# Patient Record
Sex: Male | Born: 1985 | Race: Black or African American | Hispanic: No | Marital: Single | Smoking: Current every day smoker
Health system: Southern US, Community
[De-identification: ages and names within clinical notes are randomized; demographics above are authoritative.]

---
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
## 2006-08-21 ENCOUNTER — Emergency Department: Payer: Self-pay | Admitting: Unknown Physician Specialty

## 2007-03-24 ENCOUNTER — Emergency Department: Payer: Self-pay | Admitting: Emergency Medicine

## 2008-10-12 ENCOUNTER — Emergency Department: Payer: Self-pay | Admitting: Unknown Physician Specialty

## 2011-05-17 ENCOUNTER — Emergency Department: Payer: Self-pay | Admitting: *Deleted

## 2019-07-13 ENCOUNTER — Encounter: Payer: Self-pay | Admitting: *Deleted

## 2019-07-13 ENCOUNTER — Emergency Department
Admission: EM | Admit: 2019-07-13 | Discharge: 2019-07-13 | Disposition: A | Discharge status: Home / Self Care | Payer: Self-pay | Attending: Emergency Medicine | Admitting: Emergency Medicine

## 2019-07-13 ENCOUNTER — Emergency Department: Payer: Self-pay

## 2019-07-13 ENCOUNTER — Other Ambulatory Visit: Payer: Self-pay

## 2019-07-13 DIAGNOSIS — S022XXA Fracture of nasal bones, initial encounter for closed fracture: Secondary | ICD-10-CM | POA: Insufficient documentation

## 2019-07-13 DIAGNOSIS — S0101XA Laceration without foreign body of scalp, initial encounter: Secondary | ICD-10-CM | POA: Insufficient documentation

## 2019-07-13 DIAGNOSIS — Y92410 Unspecified street and highway as the place of occurrence of the external cause: Secondary | ICD-10-CM | POA: Insufficient documentation

## 2019-07-13 DIAGNOSIS — F17200 Nicotine dependence, unspecified, uncomplicated: Secondary | ICD-10-CM | POA: Insufficient documentation

## 2019-07-13 DIAGNOSIS — R109 Unspecified abdominal pain: Secondary | ICD-10-CM | POA: Insufficient documentation

## 2019-07-13 DIAGNOSIS — S61112A Laceration without foreign body of left thumb with damage to nail, initial encounter: Secondary | ICD-10-CM | POA: Insufficient documentation

## 2019-07-13 DIAGNOSIS — Y939 Activity, unspecified: Secondary | ICD-10-CM | POA: Insufficient documentation

## 2019-07-13 DIAGNOSIS — Y999 Unspecified external cause status: Secondary | ICD-10-CM | POA: Insufficient documentation

## 2019-07-13 LAB — COMPREHENSIVE METABOLIC PANEL
ALT: 23 U/L (ref 0–44)
AST: 38 U/L (ref 15–41)
Albumin: 4.5 g/dL (ref 3.5–5.0)
Alkaline Phosphatase: 47 U/L (ref 38–126)
Anion gap: 13 (ref 5–15)
BUN: 12 mg/dL (ref 6–20)
CO2: 21 mmol/L — ABNORMAL LOW (ref 22–32)
Calcium: 9.4 mg/dL (ref 8.9–10.3)
Chloride: 100 mmol/L (ref 98–111)
Creatinine, Ser: 1.14 mg/dL (ref 0.61–1.24)
GFR calc Af Amer: >60 mL/min (ref >60)
GFR calc non Af Amer: >60 mL/min (ref >60)
Glucose, Bld: 111 mg/dL — ABNORMAL HIGH (ref 70–99)
Potassium: 3.3 mmol/L — ABNORMAL LOW (ref 3.5–5.1)
Sodium: 134 mmol/L — ABNORMAL LOW (ref 135–145)
Total Bilirubin: 0.6 mg/dL (ref 0.3–1.2)
Total Protein: 7.8 g/dL (ref 6.5–8.1)

## 2019-07-13 LAB — LIPASE, BLOOD: Lipase: 32 U/L (ref 11–51)

## 2019-07-13 LAB — CBC WITH DIFFERENTIAL/PLATELET
Abs Immature Granulocytes: 0.04 10*3/uL (ref 0.00–0.07)
Basophils Absolute: 0.1 10*3/uL (ref 0.0–0.1)
Basophils Relative: 1 %
Eosinophils Absolute: 0.3 10*3/uL (ref 0.0–0.5)
Eosinophils Relative: 3 %
HCT: 41.8 % (ref 39.0–52.0)
Hemoglobin: 14.8 g/dL (ref 13.0–17.0)
Immature Granulocytes: 0 %
Lymphocytes Relative: 33 %
Lymphs Abs: 3.1 10*3/uL (ref 0.7–4.0)
MCH: 31.7 pg (ref 26.0–34.0)
MCHC: 35.4 g/dL (ref 30.0–36.0)
MCV: 89.5 fL (ref 80.0–100.0)
Monocytes Absolute: 1 10*3/uL (ref 0.1–1.0)
Monocytes Relative: 11 %
Neutro Abs: 4.7 10*3/uL (ref 1.7–7.7)
Neutrophils Relative %: 52 %
Platelets: 248 10*3/uL (ref 150–400)
RBC: 4.67 MIL/uL (ref 4.22–5.81)
RDW: 12.7 % (ref 11.5–15.5)
WBC: 9.2 10*3/uL (ref 4.0–10.5)
nRBC: 0 % (ref 0.0–0.2)

## 2019-07-13 LAB — TYPE AND SCREEN
ABO/RH(D): A POS
Antibody Screen: NEGATIVE

## 2019-07-13 MED ORDER — IOHEXOL 300 MG/ML  SOLN
100.0000 mL | Freq: Once | INTRAMUSCULAR | Status: AC | PRN
  Administered 2019-07-13: 100 mL via INTRAVENOUS

## 2019-07-13 MED ORDER — CEPHALEXIN 500 MG PO CAPS: 500.0000 mg | ORAL_CAPSULE | Freq: Four times a day (QID) | ORAL | 0 refills | Status: AC

## 2019-07-13 MED ORDER — OXYCODONE-ACETAMINOPHEN 5-325 MG PO TABS: 1.0000 | ORAL_TABLET | ORAL | 0 refills | Status: AC | PRN

## 2019-07-13 MED ORDER — LIDOCAINE-EPINEPHRINE 2 %-1:100000 IJ SOLN
20.0000 mL | Freq: Once | INTRAMUSCULAR | Status: AC
  Administered 2019-07-13: 20 mL
  Filled 2019-07-13: qty 1

## 2019-07-13 MED ORDER — OXYCODONE-ACETAMINOPHEN 5-325 MG PO TABS
1.0000 | ORAL_TABLET | Freq: Once | ORAL | Status: AC
  Administered 2019-07-13: 1 via ORAL
  Filled 2019-07-13: qty 1

## 2019-07-13 MED ORDER — TETANUS-DIPHTH-ACELL PERTUSSIS 5-2.5-18.5 LF-MCG/0.5 IM SUSP
0.5000 mL | Freq: Once | INTRAMUSCULAR | Status: AC
  Administered 2019-07-13: 0.5 mL via INTRAMUSCULAR
  Filled 2019-07-13: qty 0.5

## 2019-07-13 MED ORDER — POTASSIUM CHLORIDE CRYS ER 20 MEQ PO TBCR
20.0000 meq | EXTENDED_RELEASE_TABLET | Freq: Once | ORAL | Status: AC
  Administered 2019-07-13: 20 meq via ORAL
  Filled 2019-07-13: qty 1

## 2019-07-13 MED ORDER — MORPHINE SULFATE (PF) 4 MG/ML IV SOLN
4.0000 mg | Freq: Once | INTRAVENOUS | Status: AC
  Administered 2019-07-13: 4 mg via INTRAVENOUS
  Filled 2019-07-13: qty 1

## 2019-07-13 MED ORDER — HYDROMORPHONE HCL 1 MG/ML IJ SOLN
0.5000 mg | INTRAMUSCULAR | Status: AC
  Administered 2019-07-13: 0.5 mg via INTRAVENOUS
  Filled 2019-07-13: qty 1

## 2019-07-13 NOTE — ED Notes (Signed)
Reviewed discharge instructions, follow-up care, laceration care, and prescriptions with patient. Patient verbalized understanding of all information reviewed. Patient stable, with no distress noted at this time.
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
## 2019-07-13 NOTE — ED Notes (Signed)
Wound care and guaze impregnated dressings applied to- R posterior FA, R hand, R thumb, L FA, L hand, multiple areas on face.
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
## 2020-07-04 DEATH — deceased
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
